# Patient Record
Sex: Female | Born: 1965 | Race: White | Hispanic: No | Marital: Married | Smoking: Never smoker
Health system: Southern US, Community
[De-identification: ages and names within clinical notes are randomized; demographics above are authoritative.]

## PROBLEM LIST (undated history)

## (undated) DIAGNOSIS — R519 Headache, unspecified: Secondary | ICD-10-CM

## (undated) DIAGNOSIS — R51 Headache: Secondary | ICD-10-CM

## (undated) HISTORY — PX: NO PAST SURGERIES: SHX2092

## (undated) HISTORY — DX: Headache, unspecified: R51.9

## (undated) HISTORY — DX: Headache: R51

---

## 1998-03-22 ENCOUNTER — Other Ambulatory Visit: Admission: RE | Admit: 1998-03-22 | Discharge: 1998-03-22 | Payer: Self-pay | Admitting: Obstetrics and Gynecology

## 1999-04-18 ENCOUNTER — Other Ambulatory Visit: Admission: RE | Admit: 1999-04-18 | Discharge: 1999-04-18 | Payer: Self-pay | Admitting: Obstetrics and Gynecology

## 2000-08-30 ENCOUNTER — Encounter: Payer: Self-pay | Admitting: Internal Medicine

## 2000-08-30 ENCOUNTER — Ambulatory Visit (HOSPITAL_COMMUNITY): Admission: RE | Admit: 2000-08-30 | Discharge: 2000-08-30 | Payer: Self-pay | Admitting: Internal Medicine

## 2000-10-11 ENCOUNTER — Other Ambulatory Visit: Admission: RE | Admit: 2000-10-11 | Discharge: 2000-10-11 | Payer: Self-pay | Admitting: Obstetrics and Gynecology

## 2001-11-14 ENCOUNTER — Other Ambulatory Visit: Admission: RE | Admit: 2001-11-14 | Discharge: 2001-11-14 | Payer: Self-pay | Admitting: Obstetrics and Gynecology

## 2002-12-21 ENCOUNTER — Other Ambulatory Visit: Admission: RE | Admit: 2002-12-21 | Discharge: 2002-12-21 | Payer: Self-pay | Admitting: Obstetrics and Gynecology

## 2004-03-21 ENCOUNTER — Other Ambulatory Visit: Admission: RE | Admit: 2004-03-21 | Discharge: 2004-03-21 | Payer: Self-pay | Admitting: Obstetrics and Gynecology

## 2005-02-13 ENCOUNTER — Ambulatory Visit (HOSPITAL_COMMUNITY): Admission: RE | Admit: 2005-02-13 | Discharge: 2005-02-13 | Payer: Self-pay | Admitting: Obstetrics and Gynecology

## 2005-04-08 ENCOUNTER — Other Ambulatory Visit: Admission: RE | Admit: 2005-04-08 | Discharge: 2005-04-08 | Payer: Self-pay | Admitting: Obstetrics and Gynecology

## 2006-02-15 ENCOUNTER — Ambulatory Visit (HOSPITAL_COMMUNITY): Admission: RE | Admit: 2006-02-15 | Discharge: 2006-02-15 | Payer: Self-pay | Admitting: Obstetrics and Gynecology

## 2006-10-19 ENCOUNTER — Ambulatory Visit: Payer: Self-pay | Admitting: Family Medicine

## 2006-10-19 DIAGNOSIS — M25569 Pain in unspecified knee: Secondary | ICD-10-CM | POA: Insufficient documentation

## 2010-05-04 ENCOUNTER — Encounter: Payer: Self-pay | Admitting: Obstetrics and Gynecology

## 2010-05-04 ENCOUNTER — Encounter: Payer: Self-pay | Admitting: Gastroenterology

## 2014-08-01 ENCOUNTER — Other Ambulatory Visit: Payer: Self-pay | Admitting: Orthopedic Surgery

## 2014-08-01 DIAGNOSIS — E041 Nontoxic single thyroid nodule: Secondary | ICD-10-CM

## 2014-08-03 ENCOUNTER — Ambulatory Visit
Admission: RE | Admit: 2014-08-03 | Discharge: 2014-08-03 | Disposition: A | Discharge status: Home / Self Care | Payer: BLUE CROSS/BLUE SHIELD | Source: Ambulatory Visit | Attending: Orthopedic Surgery | Admitting: Orthopedic Surgery

## 2014-08-03 DIAGNOSIS — E041 Nontoxic single thyroid nodule: Secondary | ICD-10-CM

## 2014-08-14 ENCOUNTER — Other Ambulatory Visit: Payer: Self-pay | Admitting: Otolaryngology

## 2014-08-14 DIAGNOSIS — J3489 Other specified disorders of nose and nasal sinuses: Secondary | ICD-10-CM

## 2014-08-14 DIAGNOSIS — R519 Headache, unspecified: Secondary | ICD-10-CM

## 2014-08-14 DIAGNOSIS — R51 Headache: Secondary | ICD-10-CM

## 2014-08-15 ENCOUNTER — Ambulatory Visit
Admission: RE | Admit: 2014-08-15 | Discharge: 2014-08-15 | Disposition: A | Discharge status: Home / Self Care | Payer: BLUE CROSS/BLUE SHIELD | Source: Ambulatory Visit | Attending: Otolaryngology | Admitting: Otolaryngology

## 2014-08-15 DIAGNOSIS — R519 Headache, unspecified: Secondary | ICD-10-CM

## 2014-08-15 DIAGNOSIS — R51 Headache: Secondary | ICD-10-CM

## 2014-08-15 DIAGNOSIS — J3489 Other specified disorders of nose and nasal sinuses: Secondary | ICD-10-CM

## 2014-08-27 ENCOUNTER — Encounter: Payer: Self-pay | Admitting: Neurology

## 2014-08-27 ENCOUNTER — Ambulatory Visit (INDEPENDENT_AMBULATORY_CARE_PROVIDER_SITE_OTHER): Payer: BLUE CROSS/BLUE SHIELD | Admitting: Neurology

## 2014-08-27 VITALS — BP 142/85 | HR 70 | Temp 97.7°F | Ht 62.5 in | Wt 120.6 lb

## 2014-08-27 DIAGNOSIS — R900 Intracranial space-occupying lesion found on diagnostic imaging of central nervous system: Secondary | ICD-10-CM

## 2014-08-27 DIAGNOSIS — R519 Headache, unspecified: Secondary | ICD-10-CM

## 2014-08-27 DIAGNOSIS — D496 Neoplasm of unspecified behavior of brain: Secondary | ICD-10-CM | POA: Diagnosis not present

## 2014-08-27 DIAGNOSIS — M5481 Occipital neuralgia: Secondary | ICD-10-CM

## 2014-08-27 DIAGNOSIS — R51 Headache: Secondary | ICD-10-CM | POA: Diagnosis not present

## 2014-08-27 MED ORDER — METHYLPREDNISOLONE 4 MG PO TBPK: ORAL_TABLET | ORAL | Status: AC

## 2014-08-27 NOTE — Patient Instructions (Addendum)
Overall you are doing fairly well but I do want to suggest a few things today:   Remember to drink plenty of fluid, eat healthy meals and do not skip any meals. Try to eat protein with a every meal and eat a healthy snack such as fruit or nuts in between meals. Try to keep a regular sleep-wake schedule and try to exercise daily, particularly in the form of walking, 20-30 minutes a day, if you can.   As far as your medications are concerned, I would like to suggest; medrol dosepak. Discussed starting Nortriptyline in the evenings if this does not work.   As far as diagnostic testing: MRI of the brain  I would like to see you back after MRI of the brain, sooner if we need to. Please call us with any interim questions, concerns, problems, updates or refill requests.   Please also call us for any test results so we can go over those with you on the phone.  My clinical assistant and will answer any of your questions and relay your messages to me and also relay most of my messages to you.   Our phone number is [phone]. We also have an after hours call service for urgent matters and there is a physician on-call for urgent questions. For any emergencies you know to call 911 or go to the nearest emergency room

## 2014-08-27 NOTE — Progress Notes (Addendum)
[facility]    Provider:  Dr Jaynee Eagles Referring Provider: Minna Merritts  CC:  Headaches  HPI:  Dawn Clayton is a lovely 49 y.o. female here as a referral for headaches.   Headache started in march. No inciting events. She is having left shoulder pain and neck pain as well. Feels like her neck is making her head hurt. MRi of the cervical spine was with degenerative disk disease. She was evaluated by Dr. Ernesto Rutherford due to nodules of the thyroid visualized on the MRI. She has pain in the back of her head. It is tender (points to the occipital area) with burning. Hurts to sleep on it with tenderness to pressure. Worse with movement of the head ie neck flex/ex and rotation. Symptoms occur every day. Not debilitating but very uncomfortable and annoying. It is stopping her from running. Headaches last hours daily. She is worried that she has something in her brain or a brain tumor. She does not have a history of migraines. These headaches are only in the occipital area, not behind the eyes and without photophobia/phonophobia. No nausea or vomiting.No vision changes.  No bowel or bladder changes. No weakness. No other focal neurologic complaints.  Reviewed notes, labs and imaging from outside physicians, which showed: Notes from Dr. Minna Merritts M.D. who is an ear nose and throat specialist reveal that she was seen earlier in May with a posterior headache for about 2 weeks. She's had sinusitis and has been treated with antibiotics and on several occasions with cloudy drainage. She has had some minimal disc bulging at C5-C6, minimal flattening of the ventral thecal sac but no central canal or foraminal stenosis at C7-T1. Dr. Berle Mull diagnosis was C5-C6 disc bulge with occipital headache pain. Multiple episodes of sinusitis in the past. She was treated with Augmentin for possibility of the sphenoid sinus problem. CAT scan of her sinuses was obtained. CT of the head showed no evidence of  any significant sinus fluid accumulation or ear level. ENT examination in his office was normal. sphenoid sinuses were clear. The frontal sinuses clear. The ethmoid and maxillary sinuses showed some minimal mucosal thickening but otherwise clear  Review of Systems: Patient complains of symptoms per HPI as well as the following symptoms: Headache, neck pain, neck stiffness. Pertinent negatives per HPI. All others negative.   History   Social History  . Marital Status: Married    Spouse Name: Dawn Clayton  . Number of Children: 2  . Years of Education: 16   Occupational History  . Unemployed    Social History Main Topics  . Smoking status: Never Smoker   . Smokeless tobacco: Not on file  . Alcohol Use: No  . Drug Use: No  . Sexual Activity: Not on file   Other Topics Concern  . Not on file   Social History Narrative   Lives at home with husband.    Caffeine use:  Drinks 1 cup coffee per day   1 soda per day   1 glass tea per day    Family History  Problem Relation Age of Onset  . Diabetes Father   . Migraines Neg Hx     Past Medical History  Diagnosis Date  . Headache     Past Surgical History  Procedure Laterality Date  . No past surgeries      Current Outpatient Prescriptions  Medication Sig Dispense Refill  . Multiple Vitamin (MULTI VITAMIN DAILY PO) Take 1 tablet by mouth daily.    Marland Kitchen  Omega-3 Fatty Acids (FISH OIL PO) Take 2 capsules by mouth daily.    . methylPREDNISolone (MEDROL DOSEPAK) 4 MG TBPK tablet follow package directions 21 tablet 0   No current facility-administered medications for this visit.    Allergies as of 08/27/2014 - Review Complete 08/27/2014  Allergen Reaction Noted  . Erythromycin  08/27/2014    Vitals: BP 142/85 mmHg  Pulse 70  Temp(Src) 97.7 F (36.5 C)  Ht 5' 2.5" (1.588 m)  Wt 120 lb 9.6 oz (54.704 kg)  BMI 21.69 kg/m2 Last Weight:  Wt Readings from Last 1 Encounters:  09/04/14 120 lb (54.432 kg)   Last Height:   Ht  Readings from Last 1 Encounters:  08/27/14 5' 2.5" (1.588 m)   Physical exam: Exam: Gen: NAD, conversant, well nourised, well groomed                     CV: RRR, no MRG. No Carotid Bruits. No peripheral edema, warm, nontender Eyes: Conjunctivae clear without exudates or hemorrhage Head: Tenderness over the affected nerve branches in the occipital rea.   Neuro: Detailed Neurologic Exam  Speech:    Speech is normal; fluent and spontaneous with normal comprehension.  Cognition:    The patient is oriented to person, place, and time;     recent and remote memory intact;     language fluent;     normal attention, concentration,     fund of knowledge Cranial Nerves:    The pupils are equal, round, and reactive to light. The fundi are normal and spontaneous venous pulsations are present. Visual fields are full to finger confrontation. Extraocular movements are intact. Trigeminal sensation is intact and the muscles of mastication are normal. The face is symmetric. The palate elevates in the midline. Hearing intact. Voice is normal. Shoulder shrug is normal. The tongue has normal motion without fasciculations.   Coordination:    Normal finger to nose and heel to shin. Normal rapid alternating movements.   Gait:    Heel-toe and tandem gait are normal.   Motor Observation:    No asymmetry, no atrophy, and no involuntary movements noted. Tone:    Normal muscle tone.    Posture:    Posture is normal. normal erect    Strength:    Strength is V/V in the upper and lower limbs.      Sensation: intact to LT     Reflex Exam:  DTR's:    Deep tendon reflexes in the upper and lower extremities are normal bilaterally.   Toes:    The toes are downgoing bilaterally.   Clonus:    Clonus is absent.       Assessment/Plan:  Patient is a lovely 50 year old female with occipital headache and possbily occipital neuralgia. She also has neck pain neck pain with movement and I agree the disk  bulge at C5-C6 is contributory. Neuro exam shows tenderness in the occipital area. Pain is bilateral and is located in the distribution of the greater, lesser and/or third occipital nerves, with tenderness and trigger points at the emergence of the greater occipital nerve. Differential includes pain in the upper cervical joints(she has a c5-c6 disk bulge), suboccipital or upper posterior neck muscles including the traps/scm, spinal and posterior cranial fossa dura mater, vertebral arteries, structural and infiltrative lesions such as meningioma, schwannoma, myelitis, compressive disk disease and others.  Will need MRI of the brain. Will order BMP.  Can treat this in several different ways. Occipital nerve  blocks can sometimes often provide relief for the patient however results can be variable. One patient can find relief for weeks and months and others not at all. Still this is first line therapy for this condition.  Can also try medication, Tegretol is first line however it carries multiple side effects. I prefer to start patient on nortriptyline or amitriptyline or neurontin. Discussed this with patient and side effects.  An initial steroid taper will often help symptoms although symptoms to frequently recur. Will try today.  Some occipital neuralgia is refractory to occipital nerve blocks and medication, and in that case we send patient's to pain management for evaluation of procedures that may help improve symptoms.  Physical therapy for neck pain.   Sarina Ill, MD  [facility] [address] [place], [place] [address]  Phone [phone] Fax [phone]

## 2014-08-28 ENCOUNTER — Telehealth: Payer: Self-pay

## 2014-08-28 LAB — BASIC METABOLIC PANEL
BUN/Creatinine Ratio: 14 (ref 9–23)
BUN: 10 mg/dL (ref 6–24)
CALCIUM: 10.2 mg/dL (ref 8.7–10.2)
CHLORIDE: 104 mmol/L (ref 97–108)
CO2: 26 mmol/L (ref 18–29)
Creatinine, Ser: 0.7 mg/dL (ref 0.57–1.00)
GFR calc Af Amer: 118 mL/min/{1.73_m2} (ref >59)
GFR, EST NON AFRICAN AMERICAN: 102 mL/min/{1.73_m2} (ref >59)
GLUCOSE: 90 mg/dL (ref 65–99)
POTASSIUM: 4.2 mmol/L (ref 3.5–5.2)
Sodium: 143 mmol/L (ref 134–144)

## 2014-08-28 NOTE — Telephone Encounter (Signed)
VM left to inform patient of normal labs, asked to call office if she has any questions.

## 2014-08-29 ENCOUNTER — Telehealth: Payer: Self-pay | Admitting: Neurology

## 2014-08-29 NOTE — Telephone Encounter (Signed)
Pt called and requested to speak with someone regarding the possibility of starting injections to relieve her pain as previously discussed in her last appt. 5/16. Please call and advise.

## 2014-08-29 NOTE — Telephone Encounter (Signed)
Please do! Give me 30 minutes and just schedule her as a follow up, not as a nerve block (just in case I don't do the blocks). thanks

## 2014-08-29 NOTE — Telephone Encounter (Signed)
Left detailed message for Dawn Clayton to let her know Dr. Jaynee Eagles would like her to schedule appt for possible nerve block injection. I gave her our phone number and office hours to call and schedule.

## 2014-08-30 NOTE — Telephone Encounter (Signed)
Left detailed message for husband about scheduling appt for nerve blocks. I told him the number we were trying to reach pt at. I gave him [facility] phone number and office hours.

## 2014-08-30 NOTE — Telephone Encounter (Signed)
Patient is returning your call. Patient can be reached at [phone].

## 2014-08-30 NOTE — Telephone Encounter (Signed)
Left another detailed message to let pt know we can schedule her for an appt for possible nerve block injections. Gave [facility] phone number and office hours; Monday-Thursday 8-5 and Fridays 8-12, however Dr. Jaynee Eagles and I are not in the office.

## 2014-08-30 NOTE — Telephone Encounter (Signed)
Spoke with pt and she is going to proceed with the MRI and not do injections at this time. I transferred her to Dawn Clayton who is going to schedule her for MRI. Pt verbalized understanding.

## 2014-09-05 ENCOUNTER — Ambulatory Visit (INDEPENDENT_AMBULATORY_CARE_PROVIDER_SITE_OTHER): Payer: BLUE CROSS/BLUE SHIELD

## 2014-09-05 DIAGNOSIS — D496 Neoplasm of unspecified behavior of brain: Secondary | ICD-10-CM

## 2014-09-05 DIAGNOSIS — R51 Headache: Secondary | ICD-10-CM

## 2014-09-05 DIAGNOSIS — R900 Intracranial space-occupying lesion found on diagnostic imaging of central nervous system: Secondary | ICD-10-CM

## 2014-09-05 DIAGNOSIS — R519 Headache, unspecified: Secondary | ICD-10-CM

## 2014-09-05 DIAGNOSIS — M5481 Occipital neuralgia: Secondary | ICD-10-CM

## 2014-09-05 MED ORDER — GADOPENTETATE DIMEGLUMINE 469.01 MG/ML IV SOLN: 11.0000 mL | Freq: Once | INTRAVENOUS | Status: AC | PRN

## 2014-09-09 ENCOUNTER — Encounter: Payer: Self-pay | Admitting: Neurology

## 2014-09-09 DIAGNOSIS — R519 Headache, unspecified: Secondary | ICD-10-CM | POA: Insufficient documentation

## 2014-09-09 DIAGNOSIS — R51 Headache: Principal | ICD-10-CM

## 2014-09-11 ENCOUNTER — Encounter: Payer: Self-pay | Admitting: *Deleted

## 2014-09-11 ENCOUNTER — Telehealth: Payer: Self-pay | Admitting: *Deleted

## 2014-09-11 NOTE — Telephone Encounter (Signed)
-----   Message from Melvenia Beam, MD sent at 09/10/2014  6:54 PM EDT ----- Please let patient know the MRI of her brain was normal, thanks

## 2014-09-11 NOTE — Progress Notes (Signed)
Faxed recent office visit note to Dr. Ernesto Rutherford office at [phone] at 4:31pm on 09/11/14. Received fax confirmation.

## 2014-09-11 NOTE — Telephone Encounter (Signed)
Spoke with patient about normal MRI results. I told her to call back if she needed anything else. Pt verbalized understanding. She stated she is going to call back to schedule for nerve blocks.

## 2015-10-29 ENCOUNTER — Ambulatory Visit: Payer: BLUE CROSS/BLUE SHIELD | Admitting: Sports Medicine

## 2015-11-12 ENCOUNTER — Ambulatory Visit: Payer: BLUE CROSS/BLUE SHIELD | Admitting: Sports Medicine

## 2015-11-20 ENCOUNTER — Ambulatory Visit: Payer: BLUE CROSS/BLUE SHIELD | Admitting: Sports Medicine

## 2018-02-28 ENCOUNTER — Other Ambulatory Visit: Payer: Self-pay | Admitting: Physician Assistant

## 2018-02-28 DIAGNOSIS — R1013 Epigastric pain: Secondary | ICD-10-CM

## 2018-03-04 ENCOUNTER — Ambulatory Visit
Admission: RE | Admit: 2018-03-04 | Discharge: 2018-03-04 | Disposition: A | Discharge status: Home / Self Care | Payer: BLUE CROSS/BLUE SHIELD | Source: Ambulatory Visit | Attending: Physician Assistant | Admitting: Physician Assistant

## 2018-03-04 DIAGNOSIS — R1013 Epigastric pain: Secondary | ICD-10-CM

## 2018-03-08 ENCOUNTER — Other Ambulatory Visit: Payer: BLUE CROSS/BLUE SHIELD

## 2018-03-21 ENCOUNTER — Other Ambulatory Visit: Payer: Self-pay | Admitting: Physician Assistant

## 2018-03-21 DIAGNOSIS — M549 Dorsalgia, unspecified: Secondary | ICD-10-CM

## 2018-03-21 DIAGNOSIS — R1013 Epigastric pain: Secondary | ICD-10-CM

## 2018-03-21 DIAGNOSIS — R110 Nausea: Secondary | ICD-10-CM

## 2018-03-25 ENCOUNTER — Other Ambulatory Visit: Payer: BLUE CROSS/BLUE SHIELD

## 2018-03-29 ENCOUNTER — Ambulatory Visit
Admission: RE | Admit: 2018-03-29 | Discharge: 2018-03-29 | Disposition: A | Discharge status: Home / Self Care | Payer: BLUE CROSS/BLUE SHIELD | Source: Ambulatory Visit | Attending: Physician Assistant | Admitting: Physician Assistant

## 2018-03-29 DIAGNOSIS — M549 Dorsalgia, unspecified: Secondary | ICD-10-CM

## 2018-03-29 DIAGNOSIS — R1013 Epigastric pain: Secondary | ICD-10-CM

## 2018-03-29 DIAGNOSIS — R110 Nausea: Secondary | ICD-10-CM

## 2018-03-29 MED ORDER — IOPAMIDOL (ISOVUE-300) INJECTION 61%
100.0000 mL | Freq: Once | INTRAVENOUS | Status: AC | PRN
  Administered 2018-03-29: 100 mL via INTRAVENOUS

## 2018-03-31 ENCOUNTER — Other Ambulatory Visit: Payer: BLUE CROSS/BLUE SHIELD

## 2018-10-07 ENCOUNTER — Other Ambulatory Visit (HOSPITAL_COMMUNITY): Payer: Self-pay | Admitting: Gastroenterology

## 2018-10-07 DIAGNOSIS — R1013 Epigastric pain: Secondary | ICD-10-CM

## 2018-10-18 ENCOUNTER — Ambulatory Visit (HOSPITAL_COMMUNITY)
Admission: RE | Admit: 2018-10-18 | Discharge: 2018-10-18 | Disposition: A | Discharge status: Home / Self Care | Payer: BC Managed Care – PPO | Source: Ambulatory Visit | Attending: Gastroenterology | Admitting: Gastroenterology

## 2018-10-18 ENCOUNTER — Other Ambulatory Visit: Payer: Self-pay

## 2018-10-18 DIAGNOSIS — R1013 Epigastric pain: Secondary | ICD-10-CM | POA: Insufficient documentation

## 2018-10-18 MED ORDER — TECHNETIUM TC 99M MEBROFENIN IV KIT
5.2000 | PACK | Freq: Once | INTRAVENOUS | Status: AC | PRN
  Administered 2018-10-18: 5.2 via INTRAVENOUS

## 2019-07-13 ENCOUNTER — Ambulatory Visit: Payer: BC Managed Care – PPO | Attending: Internal Medicine

## 2019-07-13 DIAGNOSIS — Z23 Encounter for immunization: Secondary | ICD-10-CM

## 2019-07-13 NOTE — Progress Notes (Signed)
   Covid-19 Vaccination Clinic  Name:  TAJAI DALESSANDRO    MRN: [id number] DOB: 09-19-1965  07/13/2019  Ms. Devey was observed post Covid-19 immunization for 15 minutes without incident. She was provided with Vaccine Information Sheet and instruction to access the V-Safe system.   Ms. Keightley was instructed to call 911 with any severe reactions post vaccine: Marland Kitchen Difficulty breathing  . Swelling of face and throat  . A fast heartbeat  . A bad rash all over body  . Dizziness and weakness   Immunizations Administered    Name Date Dose VIS Date Route   Pfizer COVID-19 Vaccine 07/13/2019 12:46 PM 0.3 mL 03/24/2019 Intramuscular   Manufacturer: [place]   Lot: [id number]   [place]: [id number]

## 2019-08-01 ENCOUNTER — Other Ambulatory Visit: Payer: Self-pay | Admitting: Obstetrics and Gynecology

## 2019-08-01 DIAGNOSIS — Z9189 Other specified personal risk factors, not elsewhere classified: Secondary | ICD-10-CM

## 2019-08-08 ENCOUNTER — Ambulatory Visit: Payer: BC Managed Care – PPO | Attending: Internal Medicine

## 2019-08-08 DIAGNOSIS — Z23 Encounter for immunization: Secondary | ICD-10-CM

## 2019-08-08 NOTE — Progress Notes (Signed)
   Covid-19 Vaccination Clinic  Name:  Dawn Clayton    MRN: [id number] DOB: 1965-12-10  08/08/2019  [name] was observed post Covid-19 immunization for 15 minutes without incident. She was provided with Vaccine Information Sheet and instruction to access the V-Safe system.   [name] was instructed to call 911 with any severe reactions post vaccine: Marland Kitchen Difficulty breathing  . Swelling of face and throat  . A fast heartbeat  . A bad rash all over body  . Dizziness and weakness   Immunizations Administered    Name Date Dose VIS Date Route   Pfizer COVID-19 Vaccine 08/08/2019  2:31 PM 0.3 mL 06/07/2018 Intramuscular   Manufacturer: [place]   Lot: [id number]   [place]: [id number]

## 2021-06-25 IMAGING — NM NUCLEAR MEDICINE HEPATOBILIARY IMAGING WITH GALLBLADDER EF
2 series · 12 of 12 positions shown · non-contrast
Comparison: None.

CLINICAL DATA: Epigastric pain and nausea

EXAM:
NUCLEAR MEDICINE HEPATOBILIARY IMAGING WITH GALLBLADDER EF
VIEWS:
Anterior right upper quadrant
RADIOPHARMACEUTICALS:  5.2 mCi 4c-RRm  Choletec IV

[Series 1: raw data · 4.46mm/px · 6 of 60 frames shown (1 of 2)]
[frame 6/60]
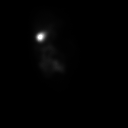
[frame 16/60]
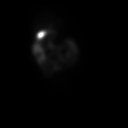
[frame 26/60]
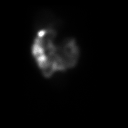
[frame 36/60]
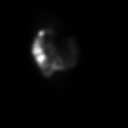
[frame 46/60]
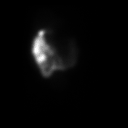
[frame 56/60]
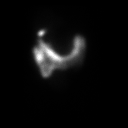

[Series 1: raw data · 4.46mm/px · 6 of 60 frames shown (2 of 2)]
[frame 6/60]
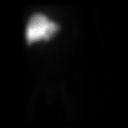
[frame 16/60]
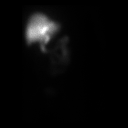
[frame 26/60]
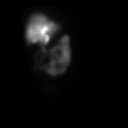
[frame 36/60]
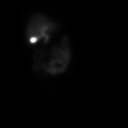
[frame 46/60]
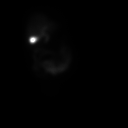
[frame 56/60]
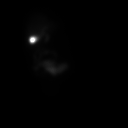

[12 of 12 positions shown; findings below may reference images not displayed]

FINDINGS: Liver uptake of radiotracer is unremarkable. There is prompt
visualization of gallbladder and small bowel, indicating patency of
the cystic and common bile ducts. The patient consumed 8 ounces of
Ensure orally with calculation of the computer generated ejection
fraction of radiotracer from the gallbladder. The patient did not
experience clinical symptoms with the oral Ensure consumption. The
computer generated ejection fraction of radiotracer from the
gallbladder is normal at 90%, normal greater than 33% using the oral
agent.
IMPRESSION: Study within normal limits.

## 2022-09-29 ENCOUNTER — Other Ambulatory Visit (HOSPITAL_COMMUNITY): Payer: Self-pay

## 2023-04-03 ENCOUNTER — Other Ambulatory Visit (HOSPITAL_BASED_OUTPATIENT_CLINIC_OR_DEPARTMENT_OTHER): Payer: Self-pay

## 2024-05-24 ENCOUNTER — Ambulatory Visit: Admitting: Urology
